# Patient Record
Sex: Female | Born: 2000 | Race: Black or African American | Hispanic: No | Marital: Single | Smoking: Never smoker
Health system: Southern US, Community
[De-identification: ages and names within clinical notes are randomized; demographics above are authoritative.]

## PROBLEM LIST (undated history)

## (undated) HISTORY — PX: APPENDECTOMY: SHX54

---
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
## 2022-03-15 ENCOUNTER — Other Ambulatory Visit: Payer: Self-pay

## 2022-03-15 ENCOUNTER — Encounter: Payer: Self-pay | Admitting: Emergency Medicine

## 2022-03-15 ENCOUNTER — Ambulatory Visit
Admission: EM | Admit: 2022-03-15 | Discharge: 2022-03-15 | Disposition: A | Discharge status: Home / Self Care | Payer: Medicaid Other | Attending: Physician Assistant | Admitting: Physician Assistant

## 2022-03-15 DIAGNOSIS — N923 Ovulation bleeding: Secondary | ICD-10-CM | POA: Diagnosis present

## 2022-03-15 LAB — URINALYSIS, ROUTINE W REFLEX MICROSCOPIC
Bilirubin Urine: NEGATIVE
Glucose, UA: NEGATIVE mg/dL
Ketones, ur: NEGATIVE mg/dL
Leukocytes,Ua: NEGATIVE
Nitrite: NEGATIVE
Protein, ur: NEGATIVE mg/dL
Specific Gravity, Urine: 1.02 (ref 1.005–1.030)
pH: 8.5 — ABNORMAL HIGH (ref 5.0–8.0)

## 2022-03-15 LAB — URINALYSIS, MICROSCOPIC (REFLEX)

## 2022-03-15 LAB — PREGNANCY, URINE: Preg Test, Ur: NEGATIVE

## 2022-03-15 NOTE — ED Triage Notes (Addendum)
Patient states that she had a period that ended 3 days ago. Patient states that it seemed like a normal period for her.  Patient states that she started having vaginal bleeding that started yesterday.  Patient denies any pain at this time. Patient denies any vaginal discharge.  Patient denies concern for STDs.  ?

## 2022-03-15 NOTE — Discharge Instructions (Signed)
-  Negative pregnancy test. ?- Urine looked okay as well.  No sign of infection. ?- Is not abnormal to have bleeding in between periods especially if you had particularly rough intercourse.  Sounds like the bleeding is slowed down which is good. ?- Contact Medicaid has you need an appointment with a either a PCP or OB/GYN to start birth control if you are not wanting to be pregnant. ?- Until then, you can go to the health department. ?- You can also use condoms.  I would suggest using condoms until you can get on birth control. ?- You should be seen again if you are develop heavy vaginal bleeding, abdominal pain, fever or worsening symptoms. ?

## 2022-03-15 NOTE — ED Provider Notes (Signed)
?[facility] ? ? ? ?CSN: [id number] ?Arrival date & time: 03/15/22  1517 ? ? ?  ? ?History   ?Chief Complaint ?Chief Complaint  ?Patient presents with  ? Vaginal Bleeding  ? ? ?HPI ?Savannah Green is a 21 y.o. female presenting for concerns about irregular vaginal bleeding.  Patient reports she typically has regular periods that occur about the same time every month.  She states her last menstrual period was on 03/07/2022 and lasted for about 4 to 5 days.  She says yesterday she hearted to have some vaginal spotting after rough intercourse with her partner.  Today, she says she had heavier vaginal bleeding with some clots.  Reports she is only had to use 1 tampon today and currently does not have any bleeding so she does not even have a tampon in.  Patient is not on birth control but wants to be.  She is sexually active with 1 partner.  She denies any associated fever, abdominal pain, urinary symptoms or vaginal discharge.  No concern for STIs.  No other complaints. ? ? ?HPI ? ?History reviewed. No pertinent past medical history. ? ?There are no problems to display for this patient. ? ? ?Past Surgical History:  ?Procedure Laterality Date  ? APPENDECTOMY    ? ? ?OB History   ?No obstetric history on file. ?  ? ? ? ?Home Medications   ? ?Prior to Admission medications   ?Not on File  ? ? ?Family History ?History reviewed. No pertinent family history. ? ?Social History ?Social History  ? ?Tobacco Use  ? Smoking status: Never  ? Smokeless tobacco: Never  ?Vaping Use  ? Vaping Use: Every day  ?Substance Use Topics  ? Alcohol use: Yes  ? Drug use: Yes  ?  Types: Marijuana  ? ? ? ?Allergies   ?Patient has no known allergies. ? ? ?Review of Systems ?Review of Systems  ?Constitutional:  Negative for fatigue and fever.  ?Gastrointestinal:  Negative for abdominal pain.  ?Genitourinary:  Positive for menstrual problem and vaginal bleeding. Negative for dyspareunia, dysuria, flank pain, frequency, genital sores,  hematuria, pelvic pain, urgency, vaginal discharge and vaginal pain.  ?Musculoskeletal:  Negative for back pain.  ? ? ?Physical Exam ?Triage Vital Signs ?ED Triage Vitals  ?Enc Vitals Group  ?   BP 03/15/22 1532 114/71  ?   Pulse Rate 03/15/22 1532 82  ?   Resp 03/15/22 1532 14  ?   Temp 03/15/22 1532 98.2 ?F (36.8 ?C)  ?   Temp Source 03/15/22 1532 Oral  ?   SpO2 03/15/22 1532 97 %  ?   Weight 03/15/22 1530 127 lb (57.6 kg)  ?   Height 03/15/22 1530 5\' 7"  (1.702 m)  ?   Head Circumference --   ?   Peak Flow --   ?   Pain Score 03/15/22 1530 0  ?   Pain Loc --   ?   Pain Edu? --   ?   Excl. in [place]? --   ? ?No data found. ? ?Updated Vital Signs ?BP 114/71 (BP Location: Left Arm)   Pulse 82   Temp 98.2 ?F (36.8 ?C) (Oral)   Resp 14   Ht 5\' 7"  (1.702 m)   Wt 127 lb (57.6 kg)   LMP 03/08/2022 (Approximate)   SpO2 97%   BMI 19.89 kg/m?  ? ?Physical Exam ?Vitals and nursing note reviewed.  ?Constitutional:   ?   General: She is not in  acute distress. ?   Appearance: Normal appearance. She is not ill-appearing or toxic-appearing.  ?HENT:  ?   Head: Normocephalic and atraumatic.  ?Eyes:  ?   General: No scleral icterus.    ?   Right eye: No discharge.     ?   Left eye: No discharge.  ?   Conjunctiva/sclera: Conjunctivae normal.  ?Cardiovascular:  ?   Rate and Rhythm: Normal rate and regular rhythm.  ?   Heart sounds: Normal heart sounds.  ?Pulmonary:  ?   Effort: Pulmonary effort is normal. No respiratory distress.  ?   Breath sounds: Normal breath sounds.  ?Abdominal:  ?   Palpations: Abdomen is soft.  ?   Tenderness: There is no abdominal tenderness. There is no right CVA tenderness, left CVA tenderness, guarding or rebound.  ?Musculoskeletal:  ?   Cervical back: Neck supple.  ?Skin: ?   General: Skin is dry.  ?Neurological:  ?   General: No focal deficit present.  ?   Mental Status: She is alert. Mental status is at baseline.  ?   Motor: No weakness.  ?   Gait: Gait normal.  ?Psychiatric:     ?   Mood and Affect:  Mood normal.     ?   Behavior: Behavior normal.     ?   Thought Content: Thought content normal.  ? ? ? ?UC Treatments / Results  ?Labs ?(all labs ordered are listed, but only abnormal results are displayed) ?Labs Reviewed  ?URINALYSIS, ROUTINE W REFLEX MICROSCOPIC - Abnormal; Notable for the following components:  ?    Result Value  ? pH 8.5 (*)   ? Hgb urine dipstick SMALL (*)   ? All other components within normal limits  ?URINALYSIS, MICROSCOPIC (REFLEX) - Abnormal; Notable for the following components:  ? Bacteria, UA RARE (*)   ? All other components within normal limits  ?PREGNANCY, URINE  ? ? ?EKG ? ? ?Radiology ?No results found. ? ?Procedures ?Procedures (including critical care time) ? ?Medications Ordered in UC ?Medications - No data to display ? ?Initial Impression / Assessment and Plan / UC Course  ?I have reviewed the triage vital signs and the nursing notes. ? ?Pertinent labs & imaging results that were available during my care of the patient were reviewed by me and considered in my medical decision making (see chart for details). ? ?21 year old female presenting for vaginal bleeding between periods.  States her last menstrual period ended 3-4 days ago.  Reports she had some more bleeding after rough intercourse yesterday.  She has no associated abdominal pain or pelvic pain.  No vaginal discharge and no concern for STIs or urinary symptoms.  Vitals are all normal and stable and she is overall well-appearing.  No abdominal tenderness, guarding or rebound and abdomen is soft. ? ?Urinalysis performed today shows small hemoglobin, otherwise normal.  Urine pregnancy test performed which is negative.  I did discuss with patient the importance of getting on birth control advised to use condoms until she can follow-up with PCP, health department or OB/GYN to get started on oral contraceptives as that is what she is looking to start.  States she used to take oral contraceptives.  Advised her it does not  necessarily abnormal to have bleeding in between.  Sometimes especially if she had a particularly rough intercourse.  Advised not to have intercourse until bleeding has resolved.  Advised to go to emergency department for any associated fever or abdominal or pelvic  pain or vaginal discharge or concern for STIs.  Otherwise, follow-up with PCP, health department or OB/GYN for birth control. ? ? ?Final Clinical Impressions(s) / UC Diagnoses  ? ?Final diagnoses:  ?Vaginal bleeding between periods  ? ? ? ?Discharge Instructions   ? ?  ?-Negative pregnancy test. ?- Urine looked okay as well.  No sign of infection. ?- Is not abnormal to have bleeding in between periods especially if you had particularly rough intercourse.  Sounds like the bleeding is slowed down which is good. ?- Contact Medicaid has you need an appointment with a either a PCP or OB/GYN to start birth control if you are not wanting to be pregnant. ?- Until then, you can go to the health department. ?- You can also use condoms.  I would suggest using condoms until you can get on birth control. ?- You should be seen again if you are develop heavy vaginal bleeding, abdominal pain, fever or worsening symptoms. ? ? ? ? ?ED Prescriptions   ?None ?  ? ?PDMP not reviewed this encounter. ?  ?Danton Clap, PA-C ?03/15/22 1628 ? ?

## 2022-04-27 ENCOUNTER — Ambulatory Visit
Admission: EM | Admit: 2022-04-27 | Discharge: 2022-04-27 | Disposition: A | Discharge status: Home / Self Care | Payer: Medicaid Other

## 2022-04-27 DIAGNOSIS — R112 Nausea with vomiting, unspecified: Secondary | ICD-10-CM | POA: Diagnosis not present

## 2022-04-27 DIAGNOSIS — N939 Abnormal uterine and vaginal bleeding, unspecified: Secondary | ICD-10-CM

## 2022-04-27 DIAGNOSIS — M419 Scoliosis, unspecified: Secondary | ICD-10-CM | POA: Insufficient documentation

## 2022-04-27 MED ORDER — ONDANSETRON 8 MG PO TBDP: 8.0000 mg | ORAL_TABLET | Freq: Three times a day (TID) | ORAL | 0 refills | Status: DC | PRN

## 2022-04-27 MED ORDER — IBUPROFEN 600 MG PO TABS: 600.0000 mg | ORAL_TABLET | Freq: Four times a day (QID) | ORAL | 0 refills | Status: DC | PRN

## 2022-04-27 NOTE — ED Triage Notes (Signed)
Patient is here for "Nausea with Vomiting". Concerned with Birth control pills "menses has been continuous". On 2nd pack now. Rx new by PCP (haven't talked to them about this). N&V started today, last emesis "1840". ?

## 2022-04-27 NOTE — Discharge Instructions (Signed)
Contact your PCP about your abnormal bleeding and discuss what you should do.. ? ?Take the Ibuprofen 600 mg every 6 hours to try and stop the bleeding. ? ?Use the Zofran every 8 hours as needed for nausea.  ?

## 2022-04-27 NOTE — ED Provider Notes (Signed)
?[place] ? ? ? ?CSN: [id number] ?Arrival date & time: 04/27/22  1902 ? ? ?  ? ?History   ?Chief Complaint ?Chief Complaint  ?Patient presents with  ? Emesis  ? Menses Problems  ? ? ?HPI ?Savannah Green is a 21 y.o. female.  ? ?HPI ? ?21 year old female here for evaluation of vaginal bleeding. ? ?Patient reports that she was prescribed a new birth control pill and started taking it as directed by her PCP.  On her second week of birth control pills her menstrual cycle started and has been continuous ever since.  She is now taking her second pack of birth control pills and is on her second dose but is still continuing to have uterine bleeding.  She states that some days are heavy and some days are light and she varies anywhere between 1-6 tampons a day.  She does have some clots that she is passing.  Today she had some nausea and vomiting.  She reports that she has had about 5 episodes of vomiting in total.  She is able to keep down fluids and food and she is not nauseous right now.  She also denies any dizziness.  She states that she was not told to wait to start her birth control until after her menstrual cycle.  Typically she is pretty regular at roughly every 28 days. ? ?History reviewed. No pertinent past medical history. ? ?Patient Active Problem List  ? Diagnosis Date Noted  ? Scoliosis 04/27/2022  ? ? ?Past Surgical History:  ?Procedure Laterality Date  ? APPENDECTOMY    ? ? ?OB History   ?No obstetric history on file. ?  ? ? ? ?Home Medications   ? ?Prior to Admission medications   ?Medication Sig Start Date End Date Taking? Authorizing Provider  ?ibuprofen (ADVIL) 600 MG tablet Take 1 tablet (600 mg total) by mouth every 6 (six) hours as needed. 04/27/22  Yes Margarette Canada, NP  ?Lexine Baton 3-0.02 MG tablet Take 1 tablet by mouth daily. "May have taken wrong, to discuss". 04/21/22  Yes [provider]  ?ondansetron (ZOFRAN-ODT) 8 MG disintegrating tablet Take 1 tablet (8 mg total) by mouth every 8  (eight) hours as needed for nausea or vomiting. 04/27/22  Yes Margarette Canada, NP  ? ? ?Family History ?History reviewed. No pertinent family history. ? ?Social History ?Social History  ? ?Tobacco Use  ? Smoking status: Never  ? Smokeless tobacco: Never  ?Vaping Use  ? Vaping Use: Every day  ?Substance Use Topics  ? Alcohol use: Yes  ?  Comment: Occ.  ? Drug use: Yes  ?  Types: Marijuana  ? ? ? ?Allergies   ?Patient has no known allergies. ? ? ?Review of Systems ?Review of Systems  ?Gastrointestinal:  Positive for nausea and vomiting.  ?Genitourinary:  Positive for vaginal bleeding. Negative for vaginal pain.  ?Neurological:  Negative for dizziness.  ? ? ?Physical Exam ?Triage Vital Signs ?ED Triage Vitals  ?Enc Vitals Group  ?   BP 04/27/22 1940 102/73  ?   Pulse Rate 04/27/22 1940 74  ?   Resp 04/27/22 1940 16  ?   Temp 04/27/22 1940 98.9 ?F (37.2 ?C)  ?   Temp Source 04/27/22 1940 Oral  ?   SpO2 04/27/22 1940 100 %  ?   Weight 04/27/22 1937 130 lb (59 kg)  ?   Height 04/27/22 1937 5\' 8"  (1.727 m)  ?   Head Circumference --   ?  Peak Flow --   ?   Pain Score 04/27/22 1934 0  ?   Pain Loc --   ?   Pain Edu? --   ?   Excl. in [place]? --   ? ?No data found. ? ?Updated Vital Signs ?BP 102/73 (BP Location: Left Arm)   Pulse 74   Temp 98.9 ?F (37.2 ?C) (Oral)   Resp 16   Ht 5\' 8"  (1.727 m)   Wt 130 lb (59 kg)   LMP 04/06/2022 (Approximate)   SpO2 100%   BMI 19.77 kg/m?  ? ?Visual Acuity ?Right Eye Distance:   ?Left Eye Distance:   ?Bilateral Distance:   ? ?Right Eye Near:   ?Left Eye Near:    ?Bilateral Near:    ? ?Physical Exam ?Vitals and nursing note reviewed.  ?Constitutional:   ?   Appearance: Normal appearance. She is not ill-appearing.  ?HENT:  ?   Head: Normocephalic and atraumatic.  ?Cardiovascular:  ?   Rate and Rhythm: Normal rate and regular rhythm.  ?   Pulses: Normal pulses.  ?   Heart sounds: Normal heart sounds. No murmur heard. ?  No friction rub. No gallop.  ?Pulmonary:  ?   Effort: Pulmonary effort  is normal.  ?   Breath sounds: Normal breath sounds. No wheezing, rhonchi or rales.  ?Abdominal:  ?   General: Abdomen is flat.  ?   Palpations: Abdomen is soft.  ?   Tenderness: There is no abdominal tenderness.  ?Skin: ?   General: Skin is warm and dry.  ?   Capillary Refill: Capillary refill takes less than 2 seconds.  ?   Coloration: Skin is not pale.  ?   Findings: No erythema.  ?Neurological:  ?   General: No focal deficit present.  ?   Mental Status: She is alert and oriented to person, place, and time.  ?Psychiatric:     ?   Mood and Affect: Mood normal.     ?   Behavior: Behavior normal.     ?   Thought Content: Thought content normal.     ?   Judgment: Judgment normal.  ? ? ? ?UC Treatments / Results  ?Labs ?(all labs ordered are listed, but only abnormal results are displayed) ?Labs Reviewed - No data to display ? ?EKG ? ? ?Radiology ?No results found. ? ?Procedures ?Procedures (including critical care time) ? ?Medications Ordered in UC ?Medications - No data to display ? ?Initial Impression / Assessment and Plan / UC Course  ?I have reviewed the triage vital signs and the nursing notes. ? ?Pertinent labs & imaging results that were available during my care of the patient were reviewed by me and considered in my medical decision making (see chart for details). ? ?Patient is a nontoxic-appearing 13 old female here for evaluation of dysfunctional uterine bleeding with the development of nausea and vomiting that started today.  The dysfunctional uterine bleeding has been going on for approximately 4 weeks and varies in intensity.  Patient has not had any dizziness however.  She is also not spoken to her primary care provider about this.  She states that she started her birth control when she first got the medication filled and not after her following menstrual cycle.  She states she was never told to do so.  She is very between 1 and 6 tampons a day with some being late and something heavier.  She has passed  occasional clots.  I  suspect that the patient is having this continued uterine bleeding secondary to the fact that she is taking her birth control while her cycle is ongoing.  I have instructed her that she is to follow-up with her PCP in regards to that.  In the meantime, I will start her on 600 mg of ibuprofen every 6 hours with food to see if I can slow down her bleeding or to stop it.  I will also prescribe her Zofran that she can take every 8 hours as needed for nausea and vomiting.  She does not have any nausea or vomiting at present. ? ? ?Final Clinical Impressions(s) / UC Diagnoses  ? ?Final diagnoses:  ?Abnormal uterine bleeding  ?Nausea and vomiting, unspecified vomiting type  ? ? ? ?Discharge Instructions   ? ?  ?Contact your PCP about your abnormal bleeding and discuss what you should do.. ? ?Take the Ibuprofen 600 mg every 6 hours to try and stop the bleeding. ? ?Use the Zofran every 8 hours as needed for nausea.  ? ? ? ? ?ED Prescriptions   ? ? Medication Sig Dispense Auth. Provider  ? ondansetron (ZOFRAN-ODT) 8 MG disintegrating tablet Take 1 tablet (8 mg total) by mouth every 8 (eight) hours as needed for nausea or vomiting. 20 tablet Margarette Canada, NP  ? ibuprofen (ADVIL) 600 MG tablet Take 1 tablet (600 mg total) by mouth every 6 (six) hours as needed. 30 tablet Margarette Canada, NP  ? ?  ? ?PDMP not reviewed this encounter. ?  ?Margarette Canada, NP ?04/27/22 2037 ? ?

## 2022-08-21 ENCOUNTER — Ambulatory Visit
Admission: EM | Admit: 2022-08-21 | Discharge: 2022-08-21 | Disposition: A | Discharge status: Home / Self Care | Payer: Medicaid Other | Attending: Family Medicine | Admitting: Family Medicine

## 2022-08-21 DIAGNOSIS — J029 Acute pharyngitis, unspecified: Secondary | ICD-10-CM

## 2022-08-21 DIAGNOSIS — Z20822 Contact with and (suspected) exposure to covid-19: Secondary | ICD-10-CM | POA: Diagnosis not present

## 2022-08-21 LAB — SARS CORONAVIRUS 2 BY RT PCR: SARS Coronavirus 2 by RT PCR: NEGATIVE

## 2022-08-21 LAB — GROUP A STREP BY PCR: Group A Strep by PCR: NOT DETECTED

## 2022-08-21 NOTE — ED Triage Notes (Signed)
Pt c/o sore throat onset last night, hurting to swallow, no fevers. Pt states she took some ibuprofen last night and alka seltzer

## 2022-08-21 NOTE — ED Provider Notes (Signed)
[facility]    CSN: [id number] Arrival date & time: 08/21/22  2440      History   Chief Complaint Chief Complaint  Patient presents with   Sore Throat    HPI Avon Molock is a 21 y.o. female.   HPI   Sheri presents for painful swallowing that started last night.  She had shortness of breath and chest tightness that resolved.  She reports no fever, cough, nausea, vomiting, diarrhea, abdominal pain or headache. Endorses nasal drainage. She tried Alka-Seltzer and ibuprofen.  She ate some spicy munchies chips last night.  She does not report anything scratching her throat.  It feels like something is stuck.  She drank some water to try to push it down but that did not help.  She reports no sick contacts.  Denies history of GERD.  Female friend notes that she has a lot of anxiety.     History reviewed. No pertinent past medical history.  Patient Active Problem List   Diagnosis Date Noted   Scoliosis 04/27/2022    Past Surgical History:  Procedure Laterality Date   APPENDECTOMY      OB History   No obstetric history on file.      Home Medications    Prior to Admission medications   Medication Sig Start Date End Date Taking? Authorizing Provider  ibuprofen (ADVIL) 600 MG tablet Take 1 tablet (600 mg total) by mouth every 6 (six) hours as needed. 04/27/22   Becky Augusta, NP  NIKKI 3-0.02 MG tablet Take 1 tablet by mouth daily. "May have taken wrong, to discuss". 04/21/22   [provider]  ondansetron (ZOFRAN-ODT) 8 MG disintegrating tablet Take 1 tablet (8 mg total) by mouth every 8 (eight) hours as needed for nausea or vomiting. 04/27/22   Becky Augusta, NP    Family History History reviewed. No pertinent family history.  Social History Social History   Tobacco Use   Smoking status: Never   Smokeless tobacco: Never  Vaping Use   Vaping Use: Every day  Substance Use Topics   Alcohol use: Yes    Comment: Occ.   Drug use: Yes    Types:  Marijuana     Allergies   Patient has no known allergies.   Review of Systems Review of Systems: negative unless otherwise stated in HPI.      Physical Exam Triage Vital Signs ED Triage Vitals [08/21/22 0949]  Enc Vitals Group     BP      Pulse      Resp      Temp      Temp src      SpO2      Weight 121 lb (54.9 kg)     Height 5\' 7"  (1.702 m)     Head Circumference      Peak Flow      Pain Score 7     Pain Loc      Pain Edu?      Excl. in [place]?    No data found.  Updated Vital Signs BP 111/74 (BP Location: Left Arm)   Pulse 65   Temp 97.9 F (36.6 C) (Oral)   Ht 5\' 7"  (1.702 m)   Wt 54.9 kg   LMP 08/14/2022 (Approximate)   SpO2 100%   BMI 18.95 kg/m   Visual Acuity Right Eye Distance:   Left Eye Distance:   Bilateral Distance:    Right Eye Near:   Left Eye Near:  Bilateral Near:     Physical Exam GEN:     alert, non-toxic appearing female in no distress    HENT:  mucus membranes moist, oropharyngeal without lesions or exudate, 1+ tonsillar hypertrophy,  no oropharyngeal erythema, no nasal discharge EYES:   pupils equal and reactive, EOMi, no scleral injection NECK:  normal ROM, anterior cervical lymphadenopathy, no meningismus, tenderness with palpation along the base of the throat near clavicles RESP:  no increased work of breathing, clear to auscultation bilaterally, no wheezing, rales or rhonchi CVS:   regular rate and rhythm, no murmurs rubs or gallops Skin:   warm and dry, no rash on visible skin, normal skin turgor    UC Treatments / Results  Labs (all labs ordered are listed, but only abnormal results are displayed) Labs Reviewed  GROUP A STREP BY PCR  SARS CORONAVIRUS 2 BY RT PCR    EKG   Radiology No results found.  Procedures Procedures (including critical care time)  Medications Ordered in UC Medications - No data to display  Initial Impression / Assessment and Plan / UC Course  I have reviewed the triage vital signs  and the nursing notes.  Pertinent labs & imaging results that were available during my care of the patient were reviewed by me and considered in my medical decision making (see chart for details).     Pt is a 21 y.o. female who presents for sore throat that started last night.  Overall patient is well-appearing, well-hydrated, afebrile and without respiratory distress.  Cardiopulmonary exam is unremarkable.  COVID and strep PCR negative.  Given that she does have some nasal drainage this may be viral pharyngitis.  She had chest pain and shortness of breath with a globus sensation which is concerning for anxiety.  She is concerned that she may have a tumor, mass or cyst.  She vapes.  Recommended patient monitor her symptoms and symptomatic treatment discussed.  Reviewed indications for acute intervention and medical care.  After shared decision making, imaging is deferred.  ED and return precautions given and patient voiced understanding.  Discussed MDM, treatment plan and plan for follow-up with patient/parent who agrees with plan.     Final Clinical Impressions(s) / UC Diagnoses   Final diagnoses:  Acute pharyngitis, unspecified etiology     Discharge Instructions      Your strep and COVID test were negative.   For painful swallowing: try warm salt water gargles, cepacol lozenges, Chloraseptic throat spray, warm tea or water with lemon/honey, popsicles or ice, or OTC cold relief medicine for throat discomfort.    For congestion: take a daily anti-histamine like Zyrtec, Claritin, and a oral decongestant, such as pseudoephedrine.  You can also use Flonase 1-2 sprays in each nostril daily.    It is important to stay hydrated: drink plenty of fluids (water, gatorade/powerade/pedialyte, juices, or teas) to keep your throat moisturized and help further relieve irritation/discomfort.    Return or go to the Emergency Department if symptoms worsen or do not improve in the next few  days      ED Prescriptions   None    PDMP not reviewed this encounter.   Katha Cabal, DO 08/21/22 1044

## 2022-08-21 NOTE — Discharge Instructions (Signed)
Your strep and COVID test were negative.   For painful swallowing: try warm salt water gargles, cepacol lozenges, Chloraseptic throat spray, warm tea or water with lemon/honey, popsicles or ice, or OTC cold relief medicine for throat discomfort.    For congestion: take a daily anti-histamine like Zyrtec, Claritin, and a oral decongestant, such as pseudoephedrine.  You can also use Flonase 1-2 sprays in each nostril daily.    It is important to stay hydrated: drink plenty of fluids (water, gatorade/powerade/pedialyte, juices, or teas) to keep your throat moisturized and help further relieve irritation/discomfort.    Return or go to the Emergency Department if symptoms worsen or do not improve in the next few days

## 2023-01-10 ENCOUNTER — Encounter: Payer: Self-pay | Admitting: Emergency Medicine

## 2023-01-10 ENCOUNTER — Ambulatory Visit
Admission: EM | Admit: 2023-01-10 | Discharge: 2023-01-10 | Disposition: A | Discharge status: Home / Self Care | Payer: Medicaid Other | Attending: Emergency Medicine | Admitting: Emergency Medicine

## 2023-01-10 DIAGNOSIS — M545 Low back pain, unspecified: Secondary | ICD-10-CM

## 2023-01-10 DIAGNOSIS — N760 Acute vaginitis: Secondary | ICD-10-CM | POA: Insufficient documentation

## 2023-01-10 DIAGNOSIS — B9689 Other specified bacterial agents as the cause of diseases classified elsewhere: Secondary | ICD-10-CM | POA: Diagnosis present

## 2023-01-10 LAB — URINALYSIS, W/ REFLEX TO CULTURE (INFECTION SUSPECTED)
Bilirubin Urine: NEGATIVE
Glucose, UA: NEGATIVE mg/dL
Hgb urine dipstick: NEGATIVE
Ketones, ur: NEGATIVE mg/dL
Leukocytes,Ua: NEGATIVE
Nitrite: NEGATIVE
Protein, ur: NEGATIVE mg/dL
Specific Gravity, Urine: 1.025 (ref 1.005–1.030)
pH: 5.5 (ref 5.0–8.0)

## 2023-01-10 LAB — WET PREP, GENITAL
Sperm: NONE SEEN
Trich, Wet Prep: NONE SEEN
WBC, Wet Prep HPF POC: <10 (ref <10)
Yeast Wet Prep HPF POC: NONE SEEN

## 2023-01-10 LAB — PREGNANCY, URINE: Preg Test, Ur: NEGATIVE

## 2023-01-10 MED ORDER — IBUPROFEN 600 MG PO TABS: 600.0000 mg | ORAL_TABLET | Freq: Four times a day (QID) | ORAL | 0 refills | Status: DC | PRN

## 2023-01-10 MED ORDER — METRONIDAZOLE 500 MG PO TABS: 500.0000 mg | ORAL_TABLET | Freq: Two times a day (BID) | ORAL | 0 refills | Status: DC

## 2023-01-10 MED ORDER — BACLOFEN 10 MG PO TABS: 10.0000 mg | ORAL_TABLET | Freq: Three times a day (TID) | ORAL | 0 refills | Status: DC

## 2023-01-10 NOTE — ED Triage Notes (Signed)
Patient c/o lower back pain and abdominal pain that started a week ago.  Patient states that she feels "gassy"  Patient states that her last BM was yesterday morning and normal.  Patient denies urinary symptoms.  Patient denies N/V.

## 2023-01-10 NOTE — Discharge Instructions (Addendum)
You have tested positive for bacterial vaginosis today.  Please take the following medications to treat that infection.  Take the Flagyl (metronidazole) 500 mg twice daily for treatment of your bacterial vaginosis.  Avoid alcohol while on the metronidazole as taken together will cause of vomiting.  Bacterial vaginosis is often caused by a imbalance of bacteria in your vaginal vault.  This is sometimes a result of using tampons or hormonal fluctuations during her menstrual cycle.  You if your symptoms are recurrent you can try using a boric acid suppository twice weekly to help maintain the acid-base balance in your vagina vault which could prevent further infection.  You can also try vaginal probiotics to help return normal bacterial balance.   I believe that your back pain is a result of muscle tension, possibly from your scoliosis, and I am going to prescribe you ibuprofen and baclofen that you can take for pain and muscle spasm.  Take the ibuprofen, 600 mg every 6 hours with food, on a schedule for the next 48 hours and then as needed.  Take the baclofen, 10 mg every 8 hours, on a schedule for the next 48 hours and then as needed.  Apply moist heat to your back for 30 minutes at a time 2-3 times a day to improve blood flow to the area and help remove the lactic acid causing the spasm.  Follow the back exercises given at discharge.  Return for reevaluation for any new or worsening symptoms.  If your back pain does not improve you may need to see a spine specialist to discuss options given your scoliosis.

## 2023-01-10 NOTE — ED Provider Notes (Addendum)
[facility]    CSN: [id number] Arrival date & time: 01/10/23  1022      History   Chief Complaint Chief Complaint  Patient presents with   Abdominal Pain   Back Pain    HPI Savannah Green is a 22 y.o. female.   HPI  22 year old female here for evaluation of mid back pain.  Patient reports that her pain started in her mid back a week ago and is worse when she lays down.  She does have a history of scoliosis.  She also reports that she had been feeling bloated and unable to pass gas but had a normal bowel movement yesterday.  She denies any abdominal pain.  She also denies fever, nausea or vomiting, pain with urination, urinary urgency or frequency, vaginal discharge, or vaginal itching.  History reviewed. No pertinent past medical history.  Patient Active Problem List   Diagnosis Date Noted   Scoliosis 04/27/2022    Past Surgical History:  Procedure Laterality Date   APPENDECTOMY      OB History   No obstetric history on file.      Home Medications    Prior to Admission medications   Medication Sig Start Date End Date Taking? Authorizing Provider  baclofen (LIORESAL) 10 MG tablet Take 1 tablet (10 mg total) by mouth 3 (three) times daily. 01/10/23  Yes Margarette Canada, NP  ibuprofen (ADVIL) 600 MG tablet Take 1 tablet (600 mg total) by mouth every 6 (six) hours as needed. 01/10/23  Yes Margarette Canada, NP  metroNIDAZOLE (FLAGYL) 500 MG tablet Take 1 tablet (500 mg total) by mouth 2 (two) times daily. 01/10/23  Yes Margarette Canada, NP    Family History History reviewed. No pertinent family history.  Social History Social History   Tobacco Use   Smoking status: Never   Smokeless tobacco: Never  Vaping Use   Vaping Use: Every day  Substance Use Topics   Alcohol use: Yes    Comment: Occ.   Drug use: Yes    Types: Marijuana     Allergies   Acetaminophen   Review of Systems Review of Systems  Constitutional:  Negative for fever.   Gastrointestinal:  Positive for abdominal distention. Negative for abdominal pain, diarrhea, nausea and vomiting.  Genitourinary:  Negative for dysuria, frequency, urgency, vaginal discharge and vaginal pain.  Musculoskeletal:  Positive for back pain.     Physical Exam Triage Vital Signs ED Triage Vitals  Enc Vitals Group     BP 01/10/23 1134 106/65     Pulse Rate 01/10/23 1134 95     Resp 01/10/23 1134 15     Temp 01/10/23 1134 99.5 F (37.5 C)     Temp Source 01/10/23 1134 Oral     SpO2 01/10/23 1134 100 %     Weight 01/10/23 1131 127 lb (57.6 kg)     Height 01/10/23 1131 5\' 7"  (1.702 m)     Head Circumference --      Peak Flow --      Pain Score 01/10/23 1131 7     Pain Loc --      Pain Edu? --      Excl. in [place]? --    No data found.  Updated Vital Signs BP 106/65 (BP Location: Left Arm)   Pulse 95   Temp 99.5 F (37.5 C) (Oral)   Resp 15   Ht 5\' 7"  (1.702 m)   Wt 127 lb (57.6 kg)   LMP 12/14/2022 (  Approximate)   SpO2 100%   BMI 19.89 kg/m   Visual Acuity Right Eye Distance:   Left Eye Distance:   Bilateral Distance:    Right Eye Near:   Left Eye Near:    Bilateral Near:     Physical Exam Vitals and nursing note reviewed.  Constitutional:      Appearance: Normal appearance. She is not ill-appearing.  HENT:     Head: Normocephalic and atraumatic.  Cardiovascular:     Rate and Rhythm: Normal rate and regular rhythm.     Pulses: Normal pulses.     Heart sounds: Normal heart sounds. No murmur heard.    No friction rub. No gallop.  Pulmonary:     Effort: Pulmonary effort is normal.     Breath sounds: Normal breath sounds. No wheezing, rhonchi or rales.  Abdominal:     General: Abdomen is flat.     Palpations: Abdomen is soft.     Tenderness: There is no abdominal tenderness. There is no right CVA tenderness, left CVA tenderness, guarding or rebound.  Musculoskeletal:        General: Tenderness present. No swelling.  Skin:    General: Skin is warm.      Capillary Refill: Capillary refill takes less than 2 seconds.     Findings: No bruising or erythema.  Neurological:     General: No focal deficit present.     Mental Status: She is alert and oriented to person, place, and time.      UC Treatments / Results  Labs (all labs ordered are listed, but only abnormal results are displayed) Labs Reviewed  WET PREP, GENITAL - Abnormal; Notable for the following components:      Result Value   Clue Cells Wet Prep HPF POC PRESENT (*)    All other components within normal limits  URINALYSIS, W/ REFLEX TO CULTURE (INFECTION SUSPECTED) - Abnormal; Notable for the following components:   Bacteria, UA RARE (*)    All other components within normal limits  PREGNANCY, URINE    EKG   Radiology No results found.  Procedures Procedures (including critical care time)  Medications Ordered in UC Medications - No data to display  Initial Impression / Assessment and Plan / UC Course  I have reviewed the triage vital signs and the nursing notes.  Pertinent labs & imaging results that were available during my care of the patient were reviewed by me and considered in my medical decision making (see chart for details).   Patient is a nontoxic-appearing 22 year old female here for evaluation of mid back pain that started last week.  She also reports that she felt she was unable to pass gas and was bloated but that improved after having a normal bowel movement yesterday.  She did not endorse any abdominal pain at present.  On exam patient has a benign cardiopulmonary exam with clear lung sounds in all fields.  Abdomen is soft, flat, and nontender.  She does have some tenderness in the lower thoracic, upper lumbar paraspinous region on the left but nothing on the right.  No midline spinous process tenderness.  The area of discomfort is at the same level as where her spine curves to the left.  This could very well be an exacerbation of musculoskeletal pain  secondary to her scoliosis.  I will also order a urinalysis and vaginal wet prep.  Urine pregnancy test was ordered at triage.  Your pregnancy test is negative.  Vaginal wet prep is  positive for clue cells but negative yeast or trichomonas.  Urinalysis shows rare bacteria.  I will discharge patient on the diagnosis of bacterial vaginosis and start her on metronidazole 5 mg twice daily for 7 days.  I do believe that some of her back pain is musculoskeletal in nature so I will prescribe ibuprofen 600 mg that she can take every 6 hours with food and baclofen 10 mg that she can take every 8 hours for inflammation and muscle tension.   Final Clinical Impressions(s) / UC Diagnoses   Final diagnoses:  BV (bacterial vaginosis)  Acute left-sided low back pain without sciatica     Discharge Instructions      You have tested positive for bacterial vaginosis today.  Please take the following medications to treat that infection.  Take the Flagyl (metronidazole) 500 mg twice daily for treatment of your bacterial vaginosis.  Avoid alcohol while on the metronidazole as taken together will cause of vomiting.  Bacterial vaginosis is often caused by a imbalance of bacteria in your vaginal vault.  This is sometimes a result of using tampons or hormonal fluctuations during her menstrual cycle.  You if your symptoms are recurrent you can try using a boric acid suppository twice weekly to help maintain the acid-base balance in your vagina vault which could prevent further infection.  You can also try vaginal probiotics to help return normal bacterial balance.   I believe that your back pain is a result of muscle tension, possibly from your scoliosis, and I am going to prescribe you ibuprofen and baclofen that you can take for pain and muscle spasm.  Take the ibuprofen, 600 mg every 6 hours with food, on a schedule for the next 48 hours and then as needed.  Take the baclofen, 10 mg every 8 hours, on a  schedule for the next 48 hours and then as needed.  Apply moist heat to your back for 30 minutes at a time 2-3 times a day to improve blood flow to the area and help remove the lactic acid causing the spasm.  Follow the back exercises given at discharge.  Return for reevaluation for any new or worsening symptoms.  If your back pain does not improve you may need to see a spine specialist to discuss options given your scoliosis.     ED Prescriptions     Medication Sig Dispense Auth. Provider   ibuprofen (ADVIL) 600 MG tablet Take 1 tablet (600 mg total) by mouth every 6 (six) hours as needed. 30 tablet Margarette Canada, NP   baclofen (LIORESAL) 10 MG tablet Take 1 tablet (10 mg total) by mouth 3 (three) times daily. 30 each Margarette Canada, NP   metroNIDAZOLE (FLAGYL) 500 MG tablet Take 1 tablet (500 mg total) by mouth 2 (two) times daily. 14 tablet Margarette Canada, NP      PDMP not reviewed this encounter.   Margarette Canada, NP 01/10/23 1212    Margarette Canada, NP 01/10/23 1213

## 2023-02-01 ENCOUNTER — Ambulatory Visit
Admission: EM | Admit: 2023-02-01 | Discharge: 2023-02-01 | Disposition: A | Discharge status: Home / Self Care | Payer: Medicaid Other | Attending: Emergency Medicine | Admitting: Emergency Medicine

## 2023-02-01 DIAGNOSIS — J069 Acute upper respiratory infection, unspecified: Secondary | ICD-10-CM | POA: Diagnosis present

## 2023-02-01 DIAGNOSIS — Z1152 Encounter for screening for COVID-19: Secondary | ICD-10-CM | POA: Diagnosis not present

## 2023-02-01 DIAGNOSIS — J020 Streptococcal pharyngitis: Secondary | ICD-10-CM | POA: Diagnosis present

## 2023-02-01 LAB — SARS CORONAVIRUS 2 BY RT PCR: SARS Coronavirus 2 by RT PCR: NEGATIVE

## 2023-02-01 LAB — RAPID INFLUENZA A&B ANTIGENS
Influenza A (ARMC): NEGATIVE
Influenza B (ARMC): NEGATIVE

## 2023-02-01 LAB — GROUP A STREP BY PCR: Group A Strep by PCR: DETECTED — AB

## 2023-02-01 MED ORDER — ONDANSETRON 8 MG PO TBDP: 8.0000 mg | ORAL_TABLET | Freq: Three times a day (TID) | ORAL | 0 refills | Status: AC | PRN

## 2023-02-01 MED ORDER — BENZONATATE 100 MG PO CAPS: 200.0000 mg | ORAL_CAPSULE | Freq: Three times a day (TID) | ORAL | 0 refills | Status: AC

## 2023-02-01 MED ORDER — PROMETHAZINE-DM 6.25-15 MG/5ML PO SYRP: 5.0000 mL | ORAL_SOLUTION | Freq: Four times a day (QID) | ORAL | 0 refills | Status: AC | PRN

## 2023-02-01 MED ORDER — IPRATROPIUM BROMIDE 0.06 % NA SOLN: 2.0000 | Freq: Four times a day (QID) | NASAL | 12 refills | Status: AC

## 2023-02-01 MED ORDER — AMOXICILLIN-POT CLAVULANATE 875-125 MG PO TABS: 1.0000 | ORAL_TABLET | Freq: Two times a day (BID) | ORAL | 0 refills | Status: AC

## 2023-02-01 NOTE — ED Triage Notes (Signed)
Pt states she woke up yesterday morning w/ sore throat, pt reports burning sensation, mucous. Pt states she had a covid test done at work this morning (negative result)

## 2023-02-01 NOTE — ED Provider Notes (Signed)
[facility]    CSN: [id number] Arrival date & time: 02/01/23  0845      History   Chief Complaint Chief Complaint  Patient presents with   Sore Throat    HPI Savannah Green is a 22 y.o. female.   HPI  22 year old female here for evaluation of sore throat.  The patient's only significant past medical history includes an appendectomy but no other medical problems or surgical history.  She is presenting for evaluation of a sore throat that started yesterday in conjunction with runny nose, nasal congestion, headache, body aches, cough that is productive of yellow sputum, shortness breath, and wheezing.  She is also had some nausea and vomiting.  She denies fever or ear pain.  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  She states that they routinely COVID test every 2 days and her test have been negative.  She describes the pain in her throat as a burning and she states that it got worse when she vomited.  History reviewed. No pertinent past medical history.  Patient Active Problem List   Diagnosis Date Noted   Scoliosis 04/27/2022    Past Surgical History:  Procedure Laterality Date   APPENDECTOMY      OB History   No obstetric history on file.      Home Medications    Prior to Admission medications   Medication Sig Start Date End Date Taking? Authorizing Provider  amoxicillin-clavulanate (AUGMENTIN) 875-125 MG tablet Take 1 tablet by mouth every 12 (twelve) hours for 10 days. 02/01/23 02/11/23 Yes Margarette Canada, NP  benzonatate (TESSALON) 100 MG capsule Take 2 capsules (200 mg total) by mouth every 8 (eight) hours. 02/01/23  Yes Margarette Canada, NP  ipratropium (ATROVENT) 0.06 % nasal spray Place 2 sprays into both nostrils 4 (four) times daily. 02/01/23  Yes Margarette Canada, NP  ondansetron (ZOFRAN-ODT) 8 MG disintegrating tablet Take 1 tablet (8 mg total) by mouth every 8 (eight) hours as needed for nausea or  vomiting. 02/01/23  Yes Margarette Canada, NP  promethazine-dextromethorphan (PROMETHAZINE-DM) 6.25-15 MG/5ML syrup Take 5 mLs by mouth 4 (four) times daily as needed. 02/01/23  Yes Margarette Canada, NP    Family History History reviewed. No pertinent family history.  Social History Social History   Tobacco Use   Smoking status: Never   Smokeless tobacco: Never  Vaping Use   Vaping Use: Every day  Substance Use Topics   Alcohol use: Yes    Comment: Occ.   Drug use: Yes    Types: Marijuana     Allergies   Acetaminophen   Review of Systems Review of Systems  Constitutional:  Negative for fever.  HENT:  Positive for congestion, rhinorrhea and sore throat. Negative for ear pain.   Respiratory:  Positive for cough. Negative for shortness of breath and wheezing.   Gastrointestinal:  Positive for nausea and vomiting.  Musculoskeletal:  Positive for arthralgias and myalgias.  Neurological:  Positive for headaches.  Hematological: Negative.   Psychiatric/Behavioral: Negative.       Physical Exam Triage Vital Signs ED Triage Vitals  Enc Vitals Group     BP 02/01/23 0944 119/77     Pulse Rate 02/01/23 0944 97     Resp --      Temp 02/01/23 0944 97.9 F (36.6 C)     Temp Source 02/01/23 0944 Oral     SpO2 02/01/23 0944 99 %  Weight 02/01/23 0941 127 lb (57.6 kg)     Height 02/01/23 0941 5' 7"$  (1.702 m)     Head Circumference --      Peak Flow --      Pain Score 02/01/23 0943 8     Pain Loc --      Pain Edu? --      Excl. in [place]? --    No data found.  Updated Vital Signs BP 119/77 (BP Location: Right Arm)   Pulse 97   Temp 97.9 F (36.6 C) (Oral)   Ht 5' 7"$  (1.702 m)   Wt 127 lb (57.6 kg)   LMP 01/09/2023 (Approximate)   SpO2 99%   BMI 19.89 kg/m   Visual Acuity Right Eye Distance:   Left Eye Distance:   Bilateral Distance:    Right Eye Near:   Left Eye Near:    Bilateral Near:     Physical Exam Vitals and nursing note reviewed.  Constitutional:       Appearance: Normal appearance. She is not ill-appearing.  HENT:     Head: Normocephalic and atraumatic.     Right Ear: Tympanic membrane, ear canal and external ear normal. There is no impacted cerumen.     Left Ear: Tympanic membrane, ear canal and external ear normal. There is no impacted cerumen.     Nose: Congestion and rhinorrhea present.     Comments: Mucosa is erythematous and mildly edematous with scant clear discharge in both nares.    Mouth/Throat:     Mouth: Mucous membranes are moist.     Pharynx: Oropharynx is clear. Posterior oropharyngeal erythema present. No oropharyngeal exudate.     Comments: Pillars are benign.  Posterior oropharynx demonstrates erythema and injection with clear postnasal drip. Neck:     Comments: Patient has bilateral anterior shotty cervical lymphadenopathy on exam. Cardiovascular:     Rate and Rhythm: Normal rate and regular rhythm.     Pulses: Normal pulses.     Heart sounds: Normal heart sounds. No murmur heard.    No friction rub. No gallop.  Pulmonary:     Effort: Pulmonary effort is normal.     Breath sounds: Normal breath sounds. No wheezing, rhonchi or rales.  Musculoskeletal:     Cervical back: Normal range of motion and neck supple. Tenderness present.  Lymphadenopathy:     Cervical: Cervical adenopathy present.  Skin:    General: Skin is warm and dry.     Capillary Refill: Capillary refill takes less than 2 seconds.     Findings: No rash.  Neurological:     General: No focal deficit present.     Mental Status: She is alert and oriented to person, place, and time.  Psychiatric:        Mood and Affect: Mood normal.        Behavior: Behavior normal.        Thought Content: Thought content normal.        Judgment: Judgment normal.      UC Treatments / Results  Labs (all labs ordered are listed, but only abnormal results are displayed) Labs Reviewed  GROUP A STREP BY PCR - Abnormal; Notable for the following components:       Result Value   Group A Strep by PCR DETECTED (*)    All other components within normal limits  SARS CORONAVIRUS 2 BY RT PCR  RAPID INFLUENZA A&B ANTIGENS    EKG   Radiology No results found.  Procedures Procedures (including critical care time)  Medications Ordered in UC Medications - No data to display  Initial Impression / Assessment and Plan / UC Course  I have reviewed the triage vital signs and the nursing notes.  Pertinent labs & imaging results that were available during my care of the patient were reviewed by me and considered in my medical decision making (see chart for details).   Patient is a nontoxic-appearing 22 year old female here for evaluation of COVID/flulike symptoms that began yesterday as outlined in HPI above.  Her predominant symptom is complaint of burning in her throat.  She has been exposed to [employer] at her job but has taken a home COVID test that was negative.  She does have inflamed nasal mucosa with clear rhinorrhea and also erythema to the posterior pharynx with clear postnasal drip.  Very mild bilateral tender anterior cervical of adenopathy is present on the current COVID exposure I will order a COVID PCR as well as a flu antigen test given her cluster of respiratory and GI symptoms.  Strep PCR is positive  Influenza antigen chest is negative for A and B.  COVID PCR is negative.  I will discharge patient home with a diagnosis of strep throat and start her on Augmentin 875 twice daily for 10 days.  Over-the-counter Tylenol and/or ibuprofen as needed for pain.  Zofran as needed for nausea and vomiting.  I will also prescribe Atrovent nasal spray, Tessalon Perles, Promethazine DM cough syrup that she can use for cough and congestion.  Work note provided.   Final Clinical Impressions(s) / UC Diagnoses   Final diagnoses:  Strep pharyngitis  Upper respiratory tract infection, unspecified type     Discharge Instructions      Take the Augmentin  twice daily for 10 days for treatment of your strep throat.  Gargle with warm salt water 2-3 times a day to soothe your throat, aid in pain relief, and aid in healing.  Take over-the-counter ibuprofen according to the package instructions as needed for pain.  You can also use Chloraseptic or Sucrets lozenges, 1 lozenge every 2 hours as needed for throat pain.  Use the Zofran 8 mg ODT tablets every 8 hours as needed for nausea and vomiting.  Use the Atrovent nasal spray, 2 squirts in each nostril every 6 hours, as needed for runny nose and postnasal drip.  Use the Tessalon Perles every 8 hours during the day.  Take them with a small sip of water.  They may give you some numbness to the base of your tongue or a metallic taste in your mouth, this is normal.  Use the Promethazine DM cough syrup at bedtime for cough and congestion.  It will make you drowsy so do not take it during the day  If you develop any new or worsening symptoms return for reevaluation.      ED Prescriptions     Medication Sig Dispense Auth. Provider   amoxicillin-clavulanate (AUGMENTIN) 875-125 MG tablet Take 1 tablet by mouth every 12 (twelve) hours for 10 days. 20 tablet Margarette Canada, NP   benzonatate (TESSALON) 100 MG capsule Take 2 capsules (200 mg total) by mouth every 8 (eight) hours. 21 capsule Margarette Canada, NP   ipratropium (ATROVENT) 0.06 % nasal spray Place 2 sprays into both nostrils 4 (four) times daily. 15 mL Margarette Canada, NP   promethazine-dextromethorphan (PROMETHAZINE-DM) 6.25-15 MG/5ML syrup Take 5 mLs by mouth 4 (four) times daily as needed. 118 mL Margarette Canada, NP  ondansetron (ZOFRAN-ODT) 8 MG disintegrating tablet Take 1 tablet (8 mg total) by mouth every 8 (eight) hours as needed for nausea or vomiting. 20 tablet Margarette Canada, NP      PDMP not reviewed this encounter.   Margarette Canada, NP 02/01/23 1057

## 2023-02-01 NOTE — Discharge Instructions (Signed)
Take the Augmentin twice daily for 10 days for treatment of your strep throat.  Gargle with warm salt water 2-3 times a day to soothe your throat, aid in pain relief, and aid in healing.  Take over-the-counter ibuprofen according to the package instructions as needed for pain.  You can also use Chloraseptic or Sucrets lozenges, 1 lozenge every 2 hours as needed for throat pain.  Use the Zofran 8 mg ODT tablets every 8 hours as needed for nausea and vomiting.  Use the Atrovent nasal spray, 2 squirts in each nostril every 6 hours, as needed for runny nose and postnasal drip.  Use the Tessalon Perles every 8 hours during the day.  Take them with a small sip of water.  They may give you some numbness to the base of your tongue or a metallic taste in your mouth, this is normal.  Use the Promethazine DM cough syrup at bedtime for cough and congestion.  It will make you drowsy so do not take it during the day  If you develop any new or worsening symptoms return for reevaluation.
